# Patient Record
Sex: Female | Born: 2000 | Race: White | Hispanic: No | Marital: Single | Smoking: Never smoker
Health system: Southern US, Community
[De-identification: ages and names within clinical notes are randomized; demographics above are authoritative.]

## PROBLEM LIST (undated history)

## (undated) DIAGNOSIS — J353 Hypertrophy of tonsils with hypertrophy of adenoids: Secondary | ICD-10-CM

## (undated) DIAGNOSIS — K0889 Other specified disorders of teeth and supporting structures: Secondary | ICD-10-CM

---
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
## 2022-04-14 DIAGNOSIS — F3281 Premenstrual dysphoric disorder: Secondary | ICD-10-CM | POA: Diagnosis not present

## 2022-04-14 DIAGNOSIS — Z0001 Encounter for general adult medical examination with abnormal findings: Secondary | ICD-10-CM | POA: Diagnosis not present

## 2022-04-14 DIAGNOSIS — N946 Dysmenorrhea, unspecified: Secondary | ICD-10-CM | POA: Diagnosis not present

## 2022-05-09 ENCOUNTER — Ambulatory Visit (INDEPENDENT_AMBULATORY_CARE_PROVIDER_SITE_OTHER): Payer: Medicaid Other | Admitting: Family Medicine

## 2022-05-09 VITALS — BP 125/80 | HR 78 | Temp 98.0°F | Ht 65.5 in | Wt 148.0 lb

## 2022-05-09 DIAGNOSIS — F419 Anxiety disorder, unspecified: Secondary | ICD-10-CM

## 2022-05-09 MED ORDER — SERTRALINE HCL 25 MG PO TABS: 25.0000 mg | ORAL_TABLET | Freq: Every day | ORAL | 0 refills | Status: DC

## 2022-05-09 NOTE — Patient Instructions (Signed)
Let me know how you're doing in 6 weeks.  Take care  Dr. Lacinda Axon

## 2022-05-09 NOTE — Progress Notes (Signed)
Subjective:  Patient ID: Theresa Benjamin, female    DOB: 06/15/2000  Age: 22 y.o. MRN: [id number]  CC: Chief Complaint  Patient presents with   Anxiety    Is currently seeing a therapist    HPI:  22 year old female presents for for evaluation of the above.  Patient states that she suffers from anxiety and panic.  Also suffers from PMDD.  Patient states that she is having difficulty managing her anxiety.  She has family dynamic issues and is also stressed with school and work.  She sees a therapist.  She states that she feels like she needs medication regarding her anxiety.  She would like to discuss this today.  Patient Active Problem List   Diagnosis Date Noted   Anxiety 05/09/2022   Tension headache 01/07/2021   Dysmenorrhea 04/27/2015   PMDD (premenstrual dysphoric disorder) 04/25/2015    Social Hx   Social History   Socioeconomic History   Marital status: Single    Spouse name: Not on file   Number of children: Not on file   Years of education: Not on file   Highest education level: Not on file  Occupational History   Not on file  Tobacco Use   Smoking status: Never   Smokeless tobacco: Never  Substance and Sexual Activity   Alcohol use: No   Drug use: No   Sexual activity: Not on file  Other Topics Concern   Not on file  Social History Narrative   Not on file   Social Determinants of Health   Financial Resource Strain: Not on file  Food Insecurity: Not on file  Transportation Needs: Not on file  Physical Activity: Not on file  Stress: Not on file  Social Connections: Not on file    Review of Systems Per HPI  Objective:  BP 125/80   Pulse 78   Temp 98 F (36.7 C)   Ht 5' 5.5" (1.664 m)   Wt 148 lb (67.1 kg)   SpO2 98%   BMI 24.25 kg/m      05/09/2022   11:30 AM 01/07/2021    3:06 PM 06/20/2020    9:08 AM  BP/Weight  Systolic BP 0000000 [id number]  Diastolic BP 80 70 79  Wt. (Lbs) 148 135.8 137.8  BMI 24.25 kg/m2 22.25 kg/m2     Physical  Exam Vitals and nursing note reviewed.  Constitutional:      General: She is not in acute distress.    Appearance: Normal appearance.  HENT:     Head: Normocephalic and atraumatic.  Eyes:     General:        Right eye: No discharge.        Left eye: No discharge.     Conjunctiva/sclera: Conjunctivae normal.  Pulmonary:     Effort: Pulmonary effort is normal. No respiratory distress.  Neurological:     Mental Status: She is alert.  Psychiatric:     Comments: Tearful.     Lab Results  Component Value Date   WBC 6.0 09/17/2017   HGB 13.7 09/17/2017   HCT 42.1 09/17/2017   PLT 290 09/17/2017   ALT 9 09/17/2017   AST 18 09/17/2017   TSH 1.490 09/17/2017     Assessment & Plan:   Problem List Items Addressed This Visit       Other   Anxiety - Primary    Starting Zoloft.  Patient will reach out to me in 6 weeks to let me know how  she is doing.      Relevant Medications   sertraline (ZOLOFT) 25 MG tablet    Meds ordered this encounter  Medications   sertraline (ZOLOFT) 25 MG tablet    Sig: Take 1 tablet (25 mg total) by mouth daily.    Dispense:  90 tablet    Refill:  0    Follow-up: Patient to reach out to me in 6 weeks  [place]

## 2022-05-09 NOTE — Assessment & Plan Note (Addendum)
Uncontrolled.  Worsening.  She is going to therapy.  Starting Zoloft.  Patient will reach out to me in 6 weeks to let me know how she is doing.

## 2022-05-29 DIAGNOSIS — R0982 Postnasal drip: Secondary | ICD-10-CM | POA: Diagnosis not present

## 2022-05-29 DIAGNOSIS — R051 Acute cough: Secondary | ICD-10-CM | POA: Diagnosis not present

## 2022-05-29 DIAGNOSIS — Z1152 Encounter for screening for COVID-19: Secondary | ICD-10-CM | POA: Diagnosis not present

## 2022-05-30 DIAGNOSIS — R0981 Nasal congestion: Secondary | ICD-10-CM | POA: Diagnosis not present

## 2022-05-30 DIAGNOSIS — H6690 Otitis media, unspecified, unspecified ear: Secondary | ICD-10-CM | POA: Diagnosis not present

## 2022-05-30 DIAGNOSIS — Z1152 Encounter for screening for COVID-19: Secondary | ICD-10-CM | POA: Diagnosis not present

## 2022-05-30 DIAGNOSIS — J069 Acute upper respiratory infection, unspecified: Secondary | ICD-10-CM | POA: Diagnosis not present

## 2022-07-28 ENCOUNTER — Other Ambulatory Visit: Payer: Self-pay | Admitting: Family Medicine

## 2022-07-31 ENCOUNTER — Telehealth: Payer: Self-pay

## 2022-07-31 DIAGNOSIS — Z111 Encounter for screening for respiratory tuberculosis: Secondary | ICD-10-CM

## 2022-07-31 NOTE — Telephone Encounter (Signed)
Pt is calling for school she needs the quantiFeron order and sent to [facility]   Theresa Benjamin [phone]

## 2022-08-01 NOTE — Telephone Encounter (Signed)
Tommie Sams, DO     Jhon Mallozzi,  Go ahead and order this for me please.  Thank you

## 2022-08-01 NOTE — Telephone Encounter (Signed)
Blood work ordered in [facility]. Left message to return call  

## 2022-08-04 MED ORDER — SERTRALINE HCL 25 MG PO TABS: 25.0000 mg | ORAL_TABLET | Freq: Every day | ORAL | 0 refills | Status: DC

## 2022-08-05 DIAGNOSIS — Z111 Encounter for screening for respiratory tuberculosis: Secondary | ICD-10-CM | POA: Diagnosis not present

## 2022-08-07 ENCOUNTER — Other Ambulatory Visit: Payer: Self-pay | Admitting: Family Medicine

## 2022-08-08 LAB — QUANTIFERON-TB GOLD PLUS
QuantiFERON Mitogen Value: >10 IU/mL
QuantiFERON Nil Value: 0.01 IU/mL
QuantiFERON TB1 Ag Value: 0 IU/mL
QuantiFERON TB2 Ag Value: 0 IU/mL
QuantiFERON-TB Gold Plus: NEGATIVE

## 2022-08-15 NOTE — Telephone Encounter (Signed)
Patient has had lab test competed

## 2022-11-02 ENCOUNTER — Other Ambulatory Visit: Payer: Self-pay | Admitting: Family Medicine
